# Patient Record
Sex: Female | Born: 1990 | Race: White | Hispanic: No | Marital: Married | Smoking: Never smoker
Health system: Southern US, Community
[De-identification: ages and names within clinical notes are randomized; demographics above are authoritative.]

## PROBLEM LIST (undated history)

## (undated) DIAGNOSIS — E079 Disorder of thyroid, unspecified: Secondary | ICD-10-CM

---

## 2013-02-24 ENCOUNTER — Emergency Department (HOSPITAL_BASED_OUTPATIENT_CLINIC_OR_DEPARTMENT_OTHER)

## 2013-02-24 ENCOUNTER — Emergency Department (HOSPITAL_BASED_OUTPATIENT_CLINIC_OR_DEPARTMENT_OTHER)
Admission: EM | Admit: 2013-02-24 | Discharge: 2013-02-24 | Disposition: A | Discharge status: Home / Self Care | Attending: Emergency Medicine | Admitting: Emergency Medicine

## 2013-02-24 ENCOUNTER — Encounter (HOSPITAL_BASED_OUTPATIENT_CLINIC_OR_DEPARTMENT_OTHER): Payer: Self-pay | Admitting: *Deleted

## 2013-02-24 DIAGNOSIS — Z862 Personal history of diseases of the blood and blood-forming organs and certain disorders involving the immune mechanism: Secondary | ICD-10-CM | POA: Insufficient documentation

## 2013-02-24 DIAGNOSIS — N949 Unspecified condition associated with female genital organs and menstrual cycle: Secondary | ICD-10-CM | POA: Insufficient documentation

## 2013-02-24 DIAGNOSIS — R109 Unspecified abdominal pain: Secondary | ICD-10-CM

## 2013-02-24 DIAGNOSIS — O219 Vomiting of pregnancy, unspecified: Secondary | ICD-10-CM | POA: Insufficient documentation

## 2013-02-24 DIAGNOSIS — O9989 Other specified diseases and conditions complicating pregnancy, childbirth and the puerperium: Secondary | ICD-10-CM | POA: Insufficient documentation

## 2013-02-24 DIAGNOSIS — Z8639 Personal history of other endocrine, nutritional and metabolic disease: Secondary | ICD-10-CM | POA: Insufficient documentation

## 2013-02-24 DIAGNOSIS — O034 Incomplete spontaneous abortion without complication: Secondary | ICD-10-CM

## 2013-02-24 HISTORY — DX: Disorder of thyroid, unspecified: E07.9

## 2013-02-24 LAB — URINALYSIS, ROUTINE W REFLEX MICROSCOPIC
Bilirubin Urine: NEGATIVE
Glucose, UA: NEGATIVE mg/dL
Ketones, ur: NEGATIVE mg/dL
Nitrite: NEGATIVE
Protein, ur: NEGATIVE mg/dL
Specific Gravity, Urine: 1.021 (ref 1.005–1.030)
Urobilinogen, UA: 0.2 mg/dL (ref 0.0–1.0)
pH: 5.5 (ref 5.0–8.0)

## 2013-02-24 LAB — URINE MICROSCOPIC-ADD ON

## 2013-02-24 LAB — HCG, QUANTITATIVE, PREGNANCY: hCG, Beta Chain, Quant, S: 758 m[IU]/mL — ABNORMAL HIGH (ref <5)

## 2013-02-24 LAB — PREGNANCY, URINE: Preg Test, Ur: POSITIVE — AB

## 2013-02-24 NOTE — Discharge Instructions (Signed)
Incomplete Miscarriage °Miscarriages in pregnancy are common. A miscarriage is a pregnancy that has ended before the twentieth week. You have had an incomplete miscarriage. Partial parts of the fetus or placenta (afterbirth) remain behind. Sometimes further treatment is needed. The most common reason for further treatment is continued bleeding (hemorrhage). Tissue left behind may also become infected. Treatment usually is curettage. Curettage for an incomplete abortion is a procedure in which the remaining products of pregnancy are removed. This can be done by a simple sucking procedure (suction curettage). It can also be done by a simple scraping (curettage) of the inside of the uterus (womb). This may be done in the hospital or in the caregiver's office. This is only done when your caregiver knows the pregnancy has ended. This is determined by physical examination and a negative pregnancy test. It may also include an ultrasound to confirm a dead fetus. The ultrasound may also prove that products of the pregnancy remain in the uterus. °If your cervix remains dilated and you are still passing clots and tissue, your caregiver may wish to watch you for a little while. Your caregiver may want to see if you are going to finish passing all of the remaining parts of the pregnancy. If the bleeding continues, they may proceed with curettage. °WHY DO I FEEL THIS WAY °Miscarriages can be a very emotional time for prospective mothers. This is not you or your partner's fault. The miscarriage did not occur because of a lack in you or your partner. Nearly all miscarriages occur because the pregnancy has started off wrongly. At least half of miscarried pregnancies have a chromosomal abnormality (almost always not inherited). Others may have developmental problems with the fetus or placentas. Problems may not show up even when the products miscarried are studied under the microscope. You can usually begin trying for another  pregnancy as soon as your caregiver says it's okay. °HOME CARE INSTRUCTIONS  °· Your caregiver may order bed rest (this means only getting up to use the bathroom). Your caregiver may allow you to continue light activity. If curettage was not done at this time, but you require further treatment. °· Keep track of the number of pads you use each day. Keep track of how saturated (soaked) they are. Record this information. °· Do not use tampons. Do not douche or have sexual intercourse until approved by your caregiver. °· It is very important to keep all follow-up appointments for re-evaluation and continuing management. °· Women who have an Rh negative blood type (ie, A, B, AB, or O negative) need to receive a drug called Rh(D) immune globulin. This medicine helps protect future fetuses against problems that can occur if an Rh negative mother is carrying a baby who is Rh positive. °SEEK IMMEDIATE MEDICAL CARE IF:  °· You experience severe cramps in your stomach, back, or abdomen. °· You run an unexplained temperature (record these). °· You pass large clots or tissue (save any tissue for your caregiver to inspect). °· Your bleeding increases or you become light-headed, weak, or have fainting episodes. °MAKE SURE YOU:  °· Understand these instructions. °· Will watch your condition. °· Will get help right away if you are not doing well or get worse. °Document Released: 09/21/2005 Document Revised: 12/14/2011 Document Reviewed: 05/11/2008 °ExitCare® Patient Information ©2014 [employer]. ° °

## 2013-02-24 NOTE — ED Provider Notes (Signed)
History     CSN: [id number]  Arrival date & time 02/24/13  1310   First MD Initiated Contact with Patient 02/24/13 1400      Chief Complaint  Patient presents with  . Abdominal Pain    (Consider location/radiation/quality/duration/timing/severity/associated sxs/prior treatment) HPI Comments: Pt with h/o hypothyroidism, has been seeing OB/GYN in [place] due to difficulty with pregnancy, became pregnant for 2nd time about 8-9 weeks ago, was found to have fluctuating HCG levels, and never higher than around 1000.  Had a recent U/S which showed no IUP.  They recommended MTX treatment since no lab progression indicated this was an abnormal pregnancy and due to the possibility of a tubal pregnancy.  This was given on Monday.  They told her to expect some bleeding and mild pain.  However if pain got worse, due to the possibility of ectopic pregnancy still, that she should be evaluated in an ED.  Today, pain got much worse.  No fever, chills, N/V.  Pain is somewhat better now, worse with certain movements.  She had a full GYN exam only a few days ago.  No syncope, CP, SOB.    Patient is a 22 y.o. female presenting with abdominal pain. The history is provided by the patient, the spouse and a relative.  Abdominal Pain Associated symptoms include abdominal pain.    Past Medical History  Diagnosis Date  . Thyroid disease     History reviewed. No pertinent past surgical history.  History reviewed. No pertinent family history.  History  Substance Use Topics  . Smoking status: Never Smoker   . Smokeless tobacco: Not on file  . Alcohol Use: No    OB History   Grav Para Term Preterm Abortions TAB SAB Ect Mult Living                  Review of Systems  Constitutional: Negative for fever and chills.  Gastrointestinal: Positive for abdominal pain. Negative for nausea and vomiting.  Genitourinary: Positive for vaginal bleeding and pelvic pain. Negative for dysuria, flank pain and  difficulty urinating.  Neurological: Negative for syncope and light-headedness.  All other systems reviewed and are negative.    Allergies  Review of patient's allergies indicates no known allergies.  Home Medications  No current outpatient prescriptions on file.  BP 113/61  Pulse 85  Temp(Src) 98.5 F (36.9 C) (Oral)  Resp 20  Wt 128 lb (58.06 kg)  SpO2 99%  LMP 02/21/2013  Physical Exam  Nursing note and vitals reviewed. Constitutional: She appears well-developed and well-nourished.  HENT:  Head: Normocephalic and atraumatic.  Eyes: EOM are normal.  Neck: Neck supple.  Cardiovascular: Normal rate and intact distal pulses.   Pulmonary/Chest: Effort normal. She has no wheezes.  Abdominal: Soft. There is tenderness. There is no rebound.  Neurological: She is alert.  Skin: Skin is warm.  Psychiatric: She has a normal mood and affect.    ED Course  Procedures (including critical care time)  Labs Reviewed  URINALYSIS, ROUTINE W REFLEX MICROSCOPIC - Abnormal; Notable for the following:    APPearance CLOUDY (*)    Hgb urine dipstick LARGE (*)    Leukocytes, UA TRACE (*)    All other components within normal limits  PREGNANCY, URINE - Abnormal; Notable for the following:    Preg Test, Ur POSITIVE (*)    All other components within normal limits  URINE MICROSCOPIC-ADD ON - Abnormal; Notable for the following:    Squamous Epithelial / LPF  MANY (*)    Bacteria, UA FEW (*)    All other components within normal limits  HCG, QUANTITATIVE, PREGNANCY - Abnormal; Notable for the following:    hCG, Beta Chain, Quant, S 758 (*)    All other components within normal limits   US Ob Comp Less 14 Wks  02/24/2013   *RADIOLOGY REPORT*  Clinical Data: Seen for 9-week obstetrical ultrasound at outside institution on 02/18/2013 with no intrauterine pregnancy visualized.  The patient was treated with methotrexate and has had pain and vaginal bleeding since 02/19/2013.  Polycystic ovarian  syndrome.  The patient was receiving fertility treatment. Quantitative beta HCG 758  OBSTETRIC <14 WK [place] AND TRANSVAGINAL OB US  Technique:  Both transabdominal and transvaginal ultrasound examinations were performed for complete evaluation of the gestation as well as the maternal uterus, adnexal regions, and pelvic cul-de-sac.  Transvaginal technique was performed to assess early pregnancy.  Comparison:  None.  Intrauterine gestational sac:  Not seen Yolk sac: Not applicable Embryo: Not applicable  Maternal uterus/adnexae: The uterus is anteverted and anteflexed and demonstrates a thin endometrial stripe which is echogenic measuring 6 mm in diameter. No signs of an intrauterine gestational sac are noted.  Both ovaries have a normal appearance with the right ovary measuring 2.2 x 2.7 x 2.1 cm and the left ovary measuring 2.7 x 2.7 x 2.5 cm.  No separate adnexal masses or complex pelvic fluid are identified.  A trace of simple free fluid is noted in the cul-de- sac.  IMPRESSION: Thin endometrial stripe with no evidence for an intrauterine pregnancy.  Normal ovaries with no adnexal masses or complex pelvic fluid to suggest ectopic pregnancy sonographically.  Correlation with serial beta HCG is recommended to assess for therapeutic response to methotrexate.  Today's findings taken alone without prior exams could represent a nonprogressing gestation, gestation too early to be seen with ultrasound or sonographically silent ectopic gestation.   Original Report Authenticated By: Rhodia Albright, M.D.   US Ob Transvaginal  02/24/2013   *RADIOLOGY REPORT*  Clinical Data: Seen for 9-week obstetrical ultrasound at outside institution on 02/18/2013 with no intrauterine pregnancy visualized.  The patient was treated with methotrexate and has had pain and vaginal bleeding since 02/19/2013.  Polycystic ovarian syndrome.  The patient was receiving fertility treatment. Quantitative beta HCG 758  OBSTETRIC <14 WK [place] AND  TRANSVAGINAL OB US  Technique:  Both transabdominal and transvaginal ultrasound examinations were performed for complete evaluation of the gestation as well as the maternal uterus, adnexal regions, and pelvic cul-de-sac.  Transvaginal technique was performed to assess early pregnancy.  Comparison:  None.  Intrauterine gestational sac:  Not seen Yolk sac: Not applicable Embryo: Not applicable  Maternal uterus/adnexae: The uterus is anteverted and anteflexed and demonstrates a thin endometrial stripe which is echogenic measuring 6 mm in diameter. No signs of an intrauterine gestational sac are noted.  Both ovaries have a normal appearance with the right ovary measuring 2.2 x 2.7 x 2.1 cm and the left ovary measuring 2.7 x 2.7 x 2.5 cm.  No separate adnexal masses or complex pelvic fluid are identified.  A trace of simple free fluid is noted in the cul-de- sac.  IMPRESSION: Thin endometrial stripe with no evidence for an intrauterine pregnancy.  Normal ovaries with no adnexal masses or complex pelvic fluid to suggest ectopic pregnancy sonographically.  Correlation with serial beta HCG is recommended to assess for therapeutic response to methotrexate.  Today's findings taken alone without prior exams could  represent a nonprogressing gestation, gestation too early to be seen with ultrasound or sonographically silent ectopic gestation.   Original Report Authenticated By: Rhodia Albright, M.D.     1. Abdominal pain   2. Incomplete miscarriage     ra sat is 99% and I interpret to be normal   3:26 PM  I reviewed U/S results, normal ovaries, no IUP .  HCG is 758, lower than her prior reported value of 800s.  Will d/c home withreassurance, referrals made to other [facility] to use as needed while here in town.  MDM  Pt is not hypotensive, no report of syncope.  Pt just received MTX and in my opinion, more severe pain today would not be out of the ordinary.  Pt is very resistant to having another pelvic examination.   Given her recent history and having had several exams recently, I think getting an quant HCG (family called and their clinic stated there most recent recheck of HCG level was down to 800 following MTX treatment) and a OB U/S here is enough to ensure that pt is not having an obvious tubal pregnancy.  If neg, pt is safe to go home and follow up with her own OB/GYN this week.  Location of pain, exam is not consistent with appendicits, diverticulitis, ureteral stone.  UA is not highly suggestive of UTI.        Gavin Pound. Aaliyana Fredericks, MD 02/24/13 1527

## 2013-02-24 NOTE — ED Notes (Addendum)
Abdominal pain x 30 minutes. Has been taking medication for infertility. She lives in [place] and visiting family. Had an injection of Methotrexate injection x 4 days ago for what they told her may be a tubal pregnancy. No u/s was ever done. She had HCG blood test. States she does not want to be here. Family made her come. She states she is not going to be here all flipping day.

## 2014-04-07 ENCOUNTER — Emergency Department (HOSPITAL_BASED_OUTPATIENT_CLINIC_OR_DEPARTMENT_OTHER): Payer: 59

## 2014-04-07 ENCOUNTER — Emergency Department (HOSPITAL_BASED_OUTPATIENT_CLINIC_OR_DEPARTMENT_OTHER)
Admission: EM | Admit: 2014-04-07 | Discharge: 2014-04-07 | Disposition: A | Discharge status: Home / Self Care | Payer: 59 | Attending: Emergency Medicine | Admitting: Emergency Medicine

## 2014-04-07 ENCOUNTER — Encounter (HOSPITAL_BASED_OUTPATIENT_CLINIC_OR_DEPARTMENT_OTHER): Payer: Self-pay | Admitting: Emergency Medicine

## 2014-04-07 DIAGNOSIS — Z79899 Other long term (current) drug therapy: Secondary | ICD-10-CM | POA: Diagnosis not present

## 2014-04-07 DIAGNOSIS — R0602 Shortness of breath: Secondary | ICD-10-CM | POA: Diagnosis not present

## 2014-04-07 DIAGNOSIS — R05 Cough: Secondary | ICD-10-CM

## 2014-04-07 DIAGNOSIS — R059 Cough, unspecified: Secondary | ICD-10-CM

## 2014-04-07 DIAGNOSIS — E079 Disorder of thyroid, unspecified: Secondary | ICD-10-CM | POA: Diagnosis not present

## 2014-04-07 DIAGNOSIS — Z3202 Encounter for pregnancy test, result negative: Secondary | ICD-10-CM | POA: Diagnosis not present

## 2014-04-07 DIAGNOSIS — J029 Acute pharyngitis, unspecified: Secondary | ICD-10-CM | POA: Diagnosis not present

## 2014-04-07 DIAGNOSIS — R51 Headache: Secondary | ICD-10-CM | POA: Diagnosis not present

## 2014-04-07 LAB — PREGNANCY, URINE: Preg Test, Ur: NEGATIVE

## 2014-04-07 MED ORDER — GUAIFENESIN-CODEINE 100-10 MG/5ML PO SOLN: 5.0000 mL | Freq: Three times a day (TID) | ORAL | Status: AC | PRN

## 2014-04-07 NOTE — ED Notes (Signed)
Pt reports that she has had cough x 1 week, worse at night

## 2014-04-07 NOTE — Discharge Instructions (Signed)
Cough, Adult  A cough is a reflex that helps clear your throat and airways. It can help heal the body or may be a reaction to an irritated airway. A cough may only last 2 or 3 weeks (acute) or may last more than 8 weeks (chronic).  CAUSES Acute cough:  Viral or bacterial infections. Chronic cough:  Infections.  Allergies.  Asthma.  Post-nasal drip.  Smoking.  Heartburn or acid reflux.  Some medicines.  Chronic lung problems (COPD).  Cancer. SYMPTOMS   Cough.  Fever.  Chest pain.  Increased breathing rate.  High-pitched whistling sound when breathing (wheezing).  Colored mucus that you cough up (sputum). TREATMENT   A bacterial cough may be treated with antibiotic medicine.  A viral cough must run its course and will not respond to antibiotics.  Your caregiver may recommend other treatments if you have a chronic cough. HOME CARE INSTRUCTIONS   Only take over-the-counter or prescription medicines for pain, discomfort, or fever as directed by your caregiver. Use cough suppressants only as directed by your caregiver.  Use a cold steam vaporizer or humidifier in your bedroom or home to help loosen secretions.  Sleep in a semi-upright position if your cough is worse at night.  Rest as needed.  Stop smoking if you smoke. SEEK IMMEDIATE MEDICAL CARE IF:   You have pus in your sputum.  Your cough starts to worsen.  You cannot control your cough with suppressants and are losing sleep.  You begin coughing up blood.  You have difficulty breathing.  You develop pain which is getting worse or is uncontrolled with medicine.  You have a fever. MAKE SURE YOU:   Understand these instructions.  Will watch your condition.  Will get help right away if you are not doing well or get worse. Document Released: 03/20/2011 Document Revised: 12/14/2011 Document Reviewed: 03/20/2011 ExitCare Patient Information 2015 [employer]. This information is not intended  to replace advice given to you by your health care provider. Make sure you discuss any questions you have with your health care provider.  

## 2014-04-07 NOTE — ED Provider Notes (Signed)
CSN: [id number]     Arrival date & time 04/07/14  2207 History  This chart was scribed for Audree CamelScott T Leiland Mihelich, MD by Modena JanskyAlbert Thayil, ED Scribe. This patient was seen in room MH06/MH06 and the patient's care was started at 10:46 PM.  Chief Complaint  Patient presents with  . Cough   The history is provided by the patient. No language interpreter was used.   HPI Comments: Betty Patterson is a 23 y.o. female who presents to the Emergency Department complaining of a cough that started a week ago. She states her coughing is worse at night along with SOB during the coughing. She states that her cough is productive but she swallows it immediately and has never seen what the sputum looks like. She reports associated sore throat and headache that also started a week ago. She states that she took Nyquil and Delsym with no relief. She denies any hx of smoking. She also denies any fever, chest pain, and rhinorrhea.  Past Medical History  Diagnosis Date  . Thyroid disease    History reviewed. No pertinent past surgical history. History reviewed. No pertinent family history. History  Substance Use Topics  . Smoking status: Never Smoker   . Smokeless tobacco: Not on file  . Alcohol Use: No   OB History   Grav Para Term Preterm Abortions TAB SAB Ect Mult Living                 Review of Systems  Constitutional: Negative for fatigue.  HENT: Positive for sore throat. Negative for rhinorrhea.   Respiratory: Positive for cough and shortness of breath.   Cardiovascular: Negative for chest pain.  Neurological: Positive for headaches.  All other systems reviewed and are negative.   Allergies  Review of patient's allergies indicates no known allergies.  Home Medications   Prior to Admission medications   Medication Sig Start Date End Date Taking? Authorizing Provider  guaiFENesin (ROBITUSSIN) 100 MG/5ML SOLN Take 5 mLs by mouth every 4 (four) hours as needed for cough or to loosen phlegm.   Yes  Historical Provider, MD  levothyroxine (SYNTHROID, LEVOTHROID) 100 MCG tablet Take 100 mcg by mouth daily before breakfast.   Yes Historical Provider, MD   BP 119/68  Pulse 87  Temp(Src) 98.2 F (36.8 C) (Oral)  Resp 20  Ht 5\' 2"  (1.575 m)  Wt 134 lb (60.782 kg)  BMI 24.50 kg/m2  SpO2 100%  LMP 03/04/2014 Physical Exam  Nursing note and vitals reviewed. Constitutional: She is oriented to person, place, and time. She appears well-developed and well-nourished. No distress.  HENT:  Head: Normocephalic and atraumatic.  Mild posterior oropharyngeal erythema. Tonsils appear normal.  Eyes: Right eye exhibits no discharge. Left eye exhibits no discharge.  Neck: Neck supple.  Cardiovascular: Normal rate, regular rhythm and normal heart sounds.   Pulmonary/Chest: Effort normal and breath sounds normal. No respiratory distress. She has no wheezes. She has no rales.  Musculoskeletal: She exhibits no edema.  Neurological: She is alert and oriented to person, place, and time.  Skin: Skin is warm and dry.  Psychiatric: She has a normal mood and affect. Her behavior is normal.    ED Course  Procedures (including critical care time) DIAGNOSTIC STUDIES: Oxygen Saturation is 100% on RA, normal by my interpretation.    COORDINATION OF CARE: 10:50 PM- Pt advised of plan for treatment which includes radiology and labs and pt agrees.  Labs Review Labs Reviewed  PREGNANCY, URINE  Imaging Review Dg Chest 2 View  04/07/2014   CLINICAL DATA:  Cough.  EXAM: CHEST  2 VIEW  COMPARISON:  None.  FINDINGS: The heart size and mediastinal contours are within normal limits. Both lungs are clear. The visualized skeletal structures are unremarkable.  IMPRESSION: No active cardiopulmonary disease.   Electronically Signed   By: Charlett NoseKevin  Dover M.D.   On: 04/07/2014 23:29     EKG Interpretation None      MDM   Final diagnoses:  Cough    No PNA on Xray. No wheezing or focal lung abnormalities. Appears  well. Most likely a viral URI given her constellation of symptoms. Will treat with cough suppressants, NSAIDs, fluids and expectant management. Advised of return precautions.   I personally performed the services described in this documentation, which was scribed in my presence. The recorded information has been reviewed and is accurate.     Audree CamelScott T Hovanes Hymas, MD 04/07/14 [phone]

## 2014-05-26 ENCOUNTER — Encounter (HOSPITAL_BASED_OUTPATIENT_CLINIC_OR_DEPARTMENT_OTHER): Payer: Self-pay | Admitting: Emergency Medicine

## 2014-05-26 ENCOUNTER — Emergency Department (HOSPITAL_BASED_OUTPATIENT_CLINIC_OR_DEPARTMENT_OTHER)
Admission: EM | Admit: 2014-05-26 | Discharge: 2014-05-26 | Disposition: A | Discharge status: Home / Self Care | Payer: 59 | Attending: Emergency Medicine | Admitting: Emergency Medicine

## 2014-05-26 ENCOUNTER — Emergency Department (HOSPITAL_BASED_OUTPATIENT_CLINIC_OR_DEPARTMENT_OTHER): Payer: 59

## 2014-05-26 DIAGNOSIS — E079 Disorder of thyroid, unspecified: Secondary | ICD-10-CM | POA: Diagnosis not present

## 2014-05-26 DIAGNOSIS — Z79899 Other long term (current) drug therapy: Secondary | ICD-10-CM | POA: Diagnosis not present

## 2014-05-26 DIAGNOSIS — N949 Unspecified condition associated with female genital organs and menstrual cycle: Secondary | ICD-10-CM | POA: Diagnosis not present

## 2014-05-26 DIAGNOSIS — R1031 Right lower quadrant pain: Secondary | ICD-10-CM | POA: Insufficient documentation

## 2014-05-26 DIAGNOSIS — R102 Pelvic and perineal pain: Secondary | ICD-10-CM

## 2014-05-26 MED ORDER — SODIUM CHLORIDE 0.9 % IV BOLUS (SEPSIS)
1000.0000 mL | Freq: Once | INTRAVENOUS | Status: AC
  Administered 2014-05-26: 1000 mL via INTRAVENOUS

## 2014-05-26 MED ORDER — ACETAMINOPHEN 500 MG PO TABS
1000.0000 mg | ORAL_TABLET | Freq: Once | ORAL | Status: AC
  Administered 2014-05-26: 1000 mg via ORAL
  Filled 2014-05-26: qty 2

## 2014-05-26 MED ORDER — HYDROCODONE-ACETAMINOPHEN 5-325 MG PO TABS: 1.0000 | ORAL_TABLET | ORAL | Status: AC | PRN

## 2014-05-26 MED ORDER — HYDROCODONE-ACETAMINOPHEN 5-325 MG PO TABS
1.0000 | ORAL_TABLET | Freq: Once | ORAL | Status: DC
  Filled 2014-05-26: qty 1

## 2014-05-26 NOTE — ED Notes (Signed)
Reports lower right abdominal pain. Pt recently had intra uterine insemination on Wednesday. Advised to come in if the pain got worse.

## 2014-05-26 NOTE — Discharge Instructions (Signed)
FOLLOW UP WITH YOUR INFERTILITY PHYSICIAN NEXT WEEK FOR RECHECK OF PELVIC PAIN THOUGHT SECONDARY TO RECENT PROCEDURE.

## 2014-05-26 NOTE — ED Provider Notes (Signed)
CSN: [id number]     Arrival date & time 05/26/14  1859 History   First MD Initiated Contact with Patient 05/26/14 1920     Chief Complaint  Patient presents with  . Abdominal Pain     (Consider location/radiation/quality/duration/timing/severity/associated sxs/prior Treatment) Patient is a 23 y.o. female presenting with abdominal pain. The history is provided by the patient. No language interpreter was used.  Abdominal Pain Pain location:  RLQ Pain quality: sharp   Associated symptoms: no chest pain, no dysuria, no fever, no nausea, no shortness of breath, no vaginal bleeding, no vaginal discharge and no vomiting   Associated symptoms comment:  She presents with complaint of right pelvic pain that started 4 days ago after artificial in[facility]. No fever, vaginal bleeding, vaginal discharge, nausea or vomiting. She was seen the day after the procedure by her infertility doctor and was told her "ovary was swollen" and if pain increased to go for a repeat ultrasound to rule out torsion.    Past Medical History  Diagnosis Date  . Thyroid disease    History reviewed. No pertinent past surgical history. No family history on file. History  Substance Use Topics  . Smoking status: Never Smoker   . Smokeless tobacco: Not on file  . Alcohol Use: No   OB History   Grav Para Term Preterm Abortions TAB SAB Ect Mult Living                 Review of Systems  Constitutional: Negative for fever.  Respiratory: Negative for shortness of breath.   Cardiovascular: Negative for chest pain.  Gastrointestinal: Positive for abdominal pain. Negative for nausea and vomiting.  Genitourinary: Positive for pelvic pain. Negative for dysuria, vaginal bleeding and vaginal discharge.  Musculoskeletal: Negative for myalgias.      Allergies  Review of patient's allergies indicates no known allergies.  Home Medications   Prior to Admission medications   Medication Sig  Start Date End Date Taking? Authorizing Provider  guaiFENesin (ROBITUSSIN) 100 MG/5ML SOLN Take 5 mLs by mouth every 4 (four) hours as needed for cough or to loosen phlegm.    Historical Provider, MD  guaiFENesin-codeine 100-10 MG/5ML syrup Take 5-10 mLs by mouth 3 (three) times daily as needed for cough. 04/07/14   Audree CamelScott T Goldston, MD  levothyroxine (SYNTHROID, LEVOTHROID) 100 MCG tablet Take 100 mcg by mouth daily before breakfast.    Historical Provider, MD   BP 136/66  Pulse 100  Temp(Src) 98 F (36.7 C) (Oral)  Resp 16  Ht 5\' 2"  (1.575 m)  Wt 140 lb (63.504 kg)  BMI 25.60 kg/m2  SpO2 100% Physical Exam  Constitutional: She is oriented to person, place, and time. She appears well-developed and well-nourished. No distress.  Neck: Normal range of motion.  Pulmonary/Chest: Effort normal.  Abdominal: She exhibits no distension and no mass. There is no guarding.  RLQ/right pelvic tenderness. No other abdominal tenderness appreciated. Abdomen soft.   Genitourinary:  Pelvic exam deferred given recent procedure.  Musculoskeletal: Normal range of motion.  Neurological: She is alert and oriented to person, place, and time.  Skin: Skin is warm and dry.  Psychiatric: She has a normal mood and affect.    ED Course  Procedures (including critical care time) Labs Review Labs Reviewed - No data to display Results for orders placed during the hospital encounter of 04/07/14  PREGNANCY, URINE      Result Value Ref Range   Preg Test, Ur  NEGATIVE  NEGATIVE   US Pelvis Complete  05/26/2014   CLINICAL DATA:  23 year old female undergoing fertility treatments, including ovarian stimulation medications and recently status post artificial insemination. Right lower quadrant pain. Initial encounter.  EXAM: TRANSABDOMINAL ULTRASOUND OF PELVIS  DOPPLER ULTRASOUND OF OVARIES  TECHNIQUE: Transabdominal ultrasound examination of the pelvis was performed including evaluation of the uterus, ovaries, adnexal  regions, and pelvic cul-de-sac.  Color and duplex Doppler ultrasound was utilized to evaluate blood flow to the ovaries.  COMPARISON:  Pelvis ultrasound 02/24/2013 and earlier.  FINDINGS: Uterus  Measurements: 8.2 x 3.6 x 5.0 cm No fibroids or other mass visualized.  Endometrium  Thickness: 16 mm  No focal abnormality visualized.  Right ovary  Measurements: 6.9 x 5.3 x 5.8 cm. Numerous follicles, individually up to 35-40 mm diameter. No adnexal mass.  Left ovary  Measurements: 3.9 x 3.1 x 3.3 cm. Multiple smaller follicles. Normal appearance/no adnexal mass.  Pulsed Doppler evaluation demonstrates normal low-resistance arterial and venous waveforms in both ovaries.  Other findings:  No free fluid.  IMPRESSION: No pathology identified in the pelvis. No evidence of ovarian torsion. Right greater than left ovarian follicles compatible with recent ovarian stimulation.   Electronically Signed   By: Augusto Gamble M.D.   On: 05/26/2014 20:58   [place] Art/ven Flow Abd Pelv Doppler  05/26/2014   CLINICAL DATA:  23 year old female undergoing fertility treatments, including ovarian stimulation medications and recently status post artificial insemination. Right lower quadrant pain. Initial encounter.  EXAM: TRANSABDOMINAL ULTRASOUND OF PELVIS  DOPPLER ULTRASOUND OF OVARIES  TECHNIQUE: Transabdominal ultrasound examination of the pelvis was performed including evaluation of the uterus, ovaries, adnexal regions, and pelvic cul-de-sac.  Color and duplex Doppler ultrasound was utilized to evaluate blood flow to the ovaries.  COMPARISON:  Pelvis ultrasound 02/24/2013 and earlier.  FINDINGS: Uterus  Measurements: 8.2 x 3.6 x 5.0 cm No fibroids or other mass visualized.  Endometrium  Thickness: 16 mm  No focal abnormality visualized.  Right ovary  Measurements: 6.9 x 5.3 x 5.8 cm. Numerous follicles, individually up to 35-40 mm diameter. No adnexal mass.  Left ovary  Measurements: 3.9 x 3.1 x 3.3 cm. Multiple smaller follicles. Normal  appearance/no adnexal mass.  Pulsed Doppler evaluation demonstrates normal low-resistance arterial and venous waveforms in both ovaries.  Other findings:  No free fluid.  IMPRESSION: No pathology identified in the pelvis. No evidence of ovarian torsion. Right greater than left ovarian follicles compatible with recent ovarian stimulation.   Electronically Signed   By: Augusto Gamble M.D.   On: 05/26/2014 20:58   Imaging Review No results found.   EKG Interpretation None      MDM   Final diagnoses:  None    1. Pelvic pain  US showing enlarged ovaries c/w follicle stimulation. No torsion. Discussed with infertility clinic in [facility] who advises pain medication ok, follow up in office this week. No further evaluation. Patient is feeling better with pain management in ED.     Arnoldo Hooker, PA-C 06/02/14 1004

## 2014-06-03 NOTE — ED Provider Notes (Signed)
  Medical screening examination/treatment/procedure(s) were performed by non-physician practitioner and as supervising physician I was immediately available for consultation/collaboration.   EKG Interpretation None         Gerhard Munch, MD 06/03/14 0201

## 2014-12-26 IMAGING — US US ART/VEN ABD/PELV/SCROTUM DOPPLER LTD
1 series · 3 of 3 positions shown · non-contrast
Comparison: Pelvis ultrasound 02/24/2013 and earlier.

CLINICAL DATA: 23-year-old female undergoing fertility treatments,
including ovarian stimulation medications and recently status post
artificial insemination. Right lower quadrant pain. Initial
encounter.

EXAM:
TRANSABDOMINAL ULTRASOUND OF PELVIS
DOPPLER ULTRASOUND OF OVARIES
TECHNIQUE: Transabdominal ultrasound examination of the pelvis was performed
including evaluation of the uterus, ovaries, adnexal regions, and
pelvic cul-de-sac.
Color and duplex Doppler ultrasound was utilized to evaluate blood
flow to the ovaries.

[Series 1: us art/ven abd/pelv/scrotum doppler ltd · 0.30mm/px · 3 of 3 slices shown]
[im 1/3]
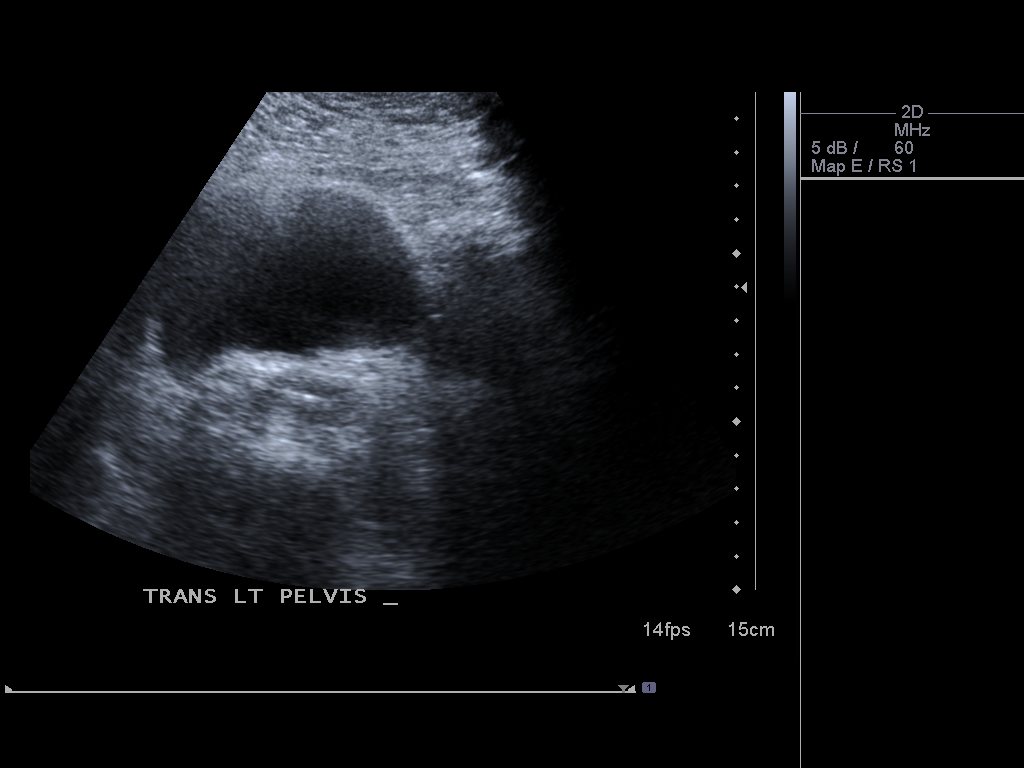
[im 2/3]
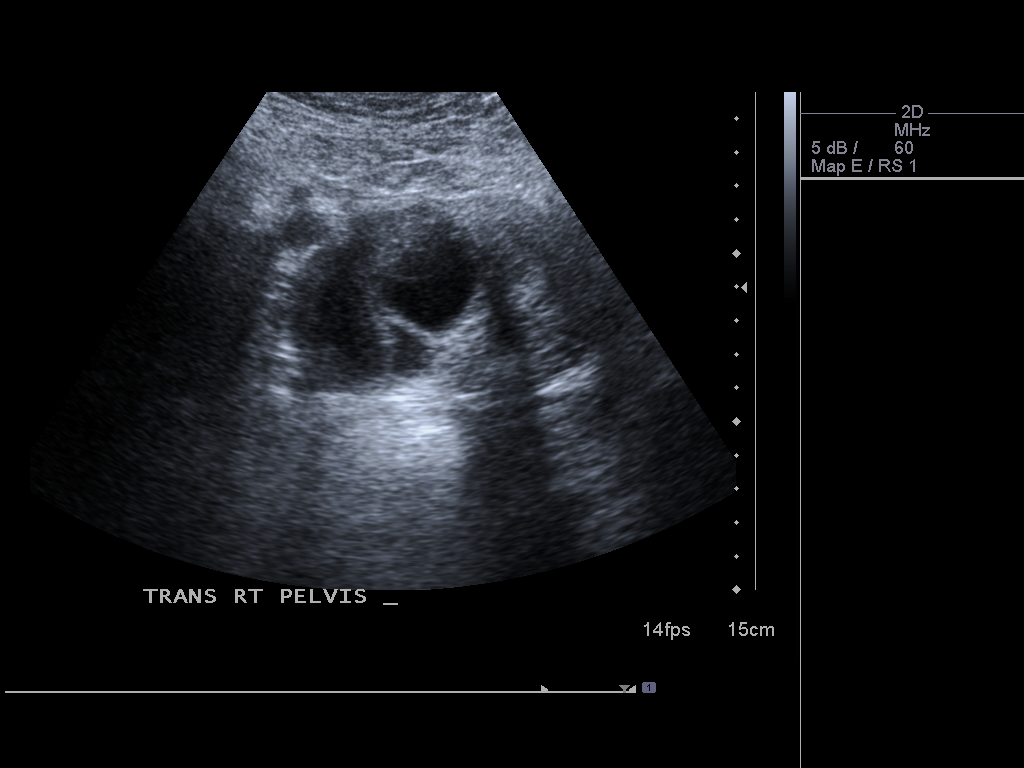
[im 3/3]
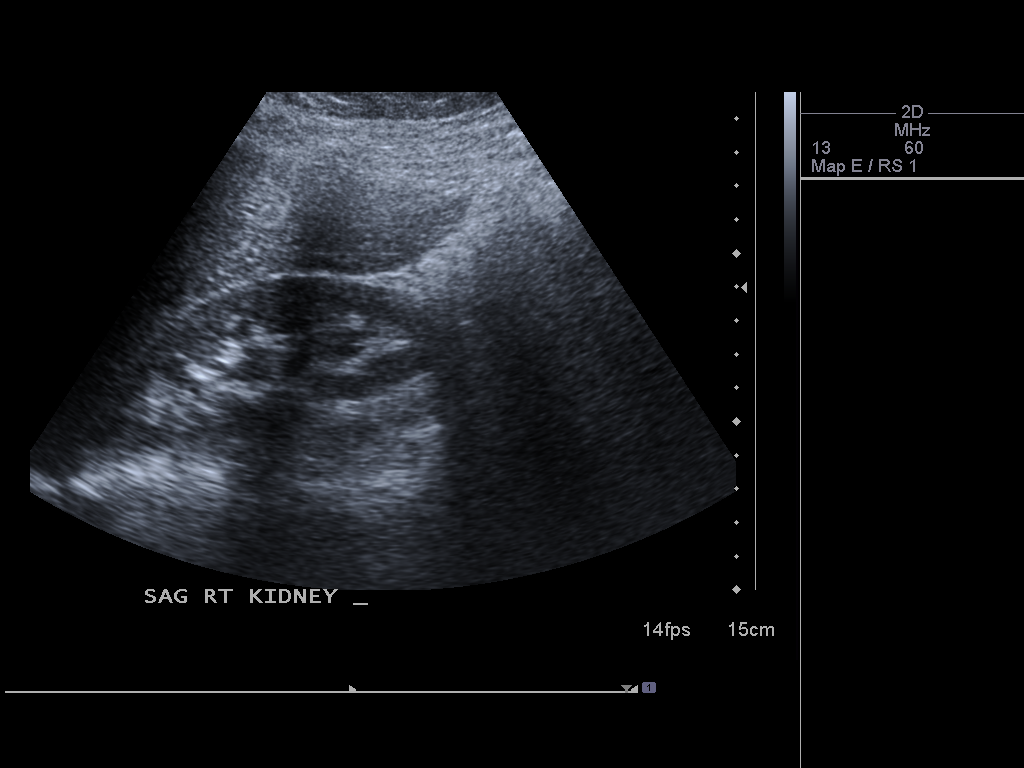

[3 of 3 positions shown; findings below may reference images not displayed]

FINDINGS: Uterus

Measurements: 8.2 x 3.6 x 5.0 cm No fibroids or other mass
visualized.

Endometrium

Thickness: 16 mm  No focal abnormality visualized.

Right ovary

Measurements: 6.9 x 5.3 x 5.8 cm. Numerous follicles, individually
up to 35-40 mm diameter. No adnexal mass.

Left ovary

Measurements: 3.9 x 3.1 x 3.3 cm. Multiple smaller follicles. Normal
appearance/no adnexal mass.

Pulsed Doppler evaluation demonstrates normal low-resistance
arterial and venous waveforms in both ovaries.

Other findings:

No free fluid.
IMPRESSION: No pathology identified in the pelvis. No evidence of ovarian
torsion. Right greater than left ovarian follicles compatible with
recent ovarian stimulation.

## 2014-12-26 IMAGING — US US ART/VEN ABD/PELV/SCROTUM DOPPLER LTD
1 series · 13 of 25 positions shown · non-contrast
Comparison: Pelvis ultrasound 02/24/2013 and earlier.

CLINICAL DATA: 23-year-old female undergoing fertility treatments,
including ovarian stimulation medications and recently status post
artificial insemination. Right lower quadrant pain. Initial
encounter.

EXAM:
TRANSABDOMINAL ULTRASOUND OF PELVIS
DOPPLER ULTRASOUND OF OVARIES
TECHNIQUE: Transabdominal ultrasound examination of the pelvis was performed
including evaluation of the uterus, ovaries, adnexal regions, and
pelvic cul-de-sac.
Color and duplex Doppler ultrasound was utilized to evaluate blood
flow to the ovaries.

[Series 1: us art/ven abd/pelv/scrotum doppler ltd · 0.28mm/px · 78 acquisitions, 13 frames shown]
[im 1/78]
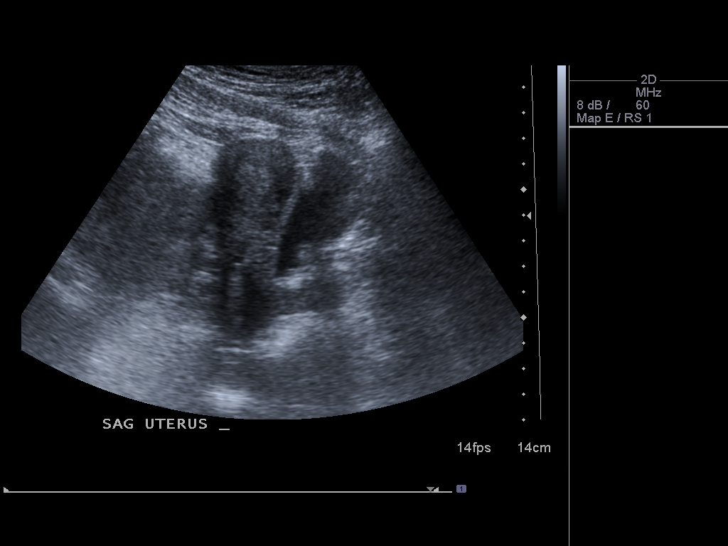
[im 7/78]
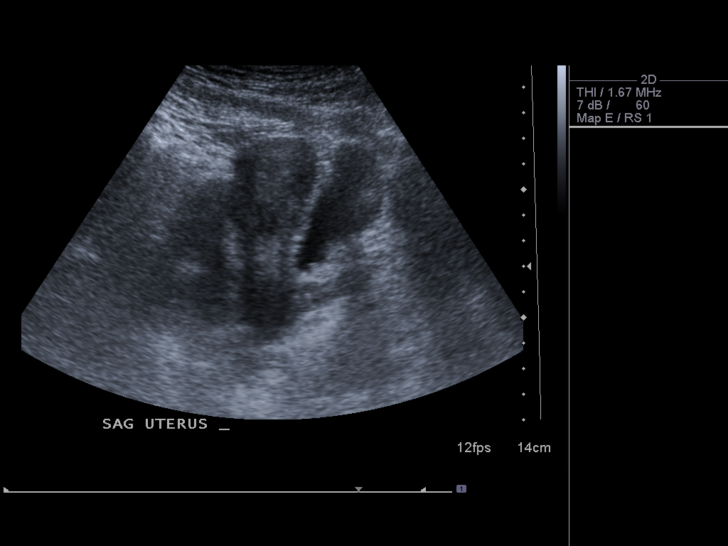
[im 13/78]
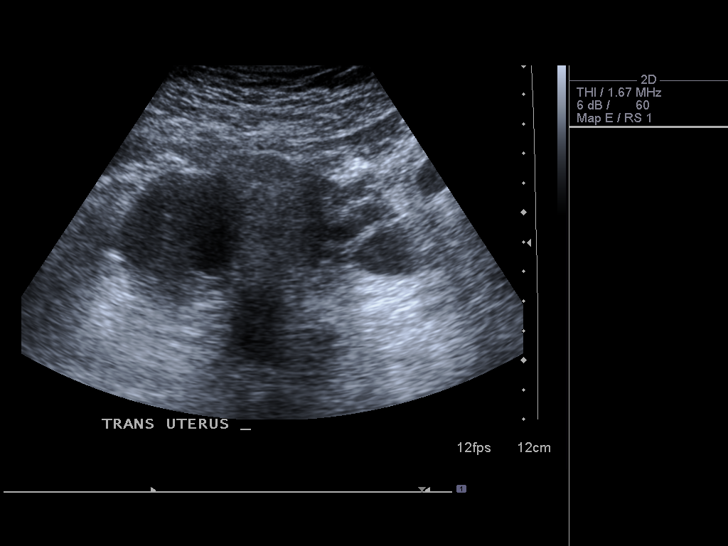
[im 20/78]
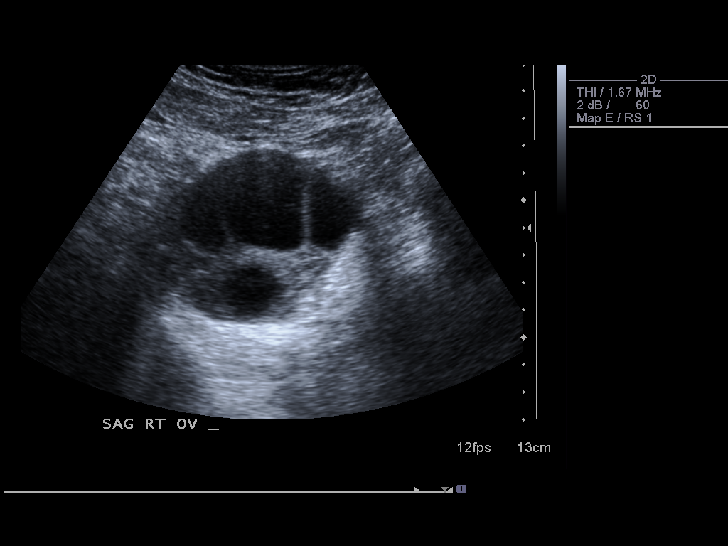
[im 26/78]
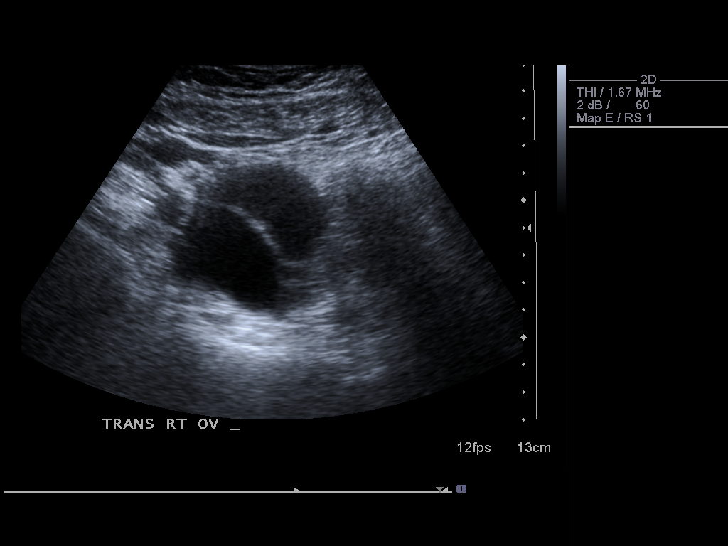
[im 33/78]
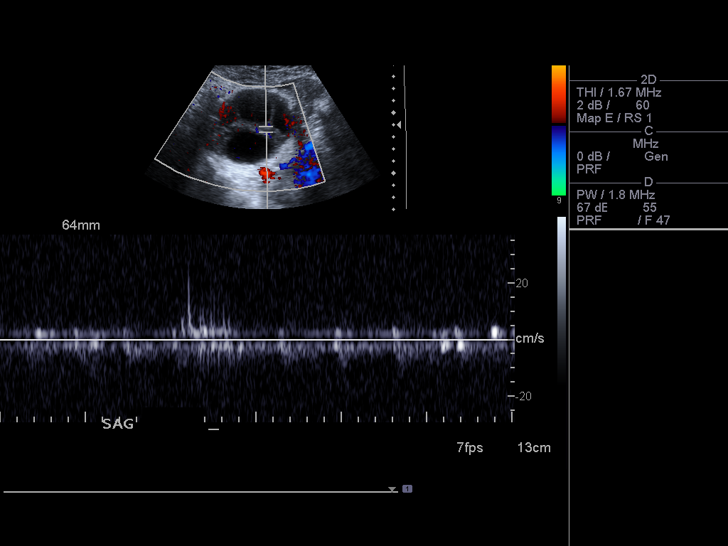
[im 39/78]
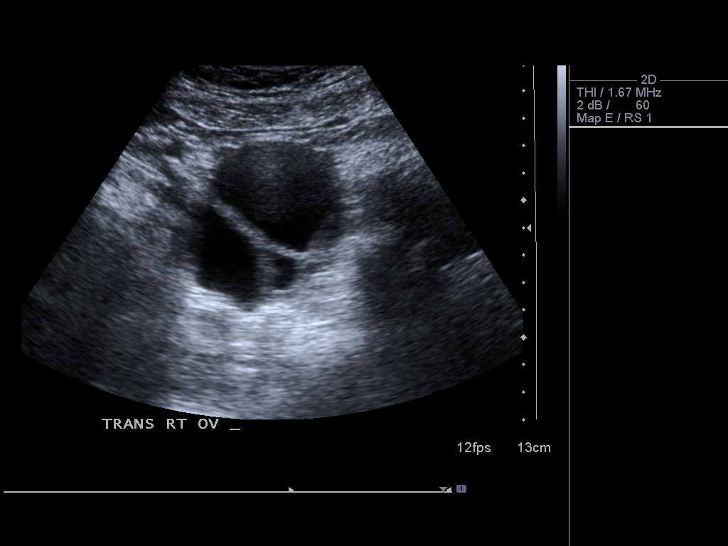
[im 45/78]
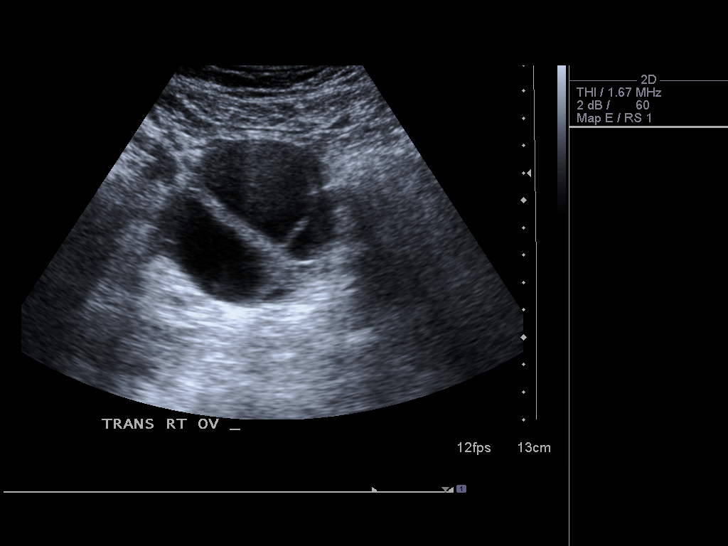
[im 52/78]
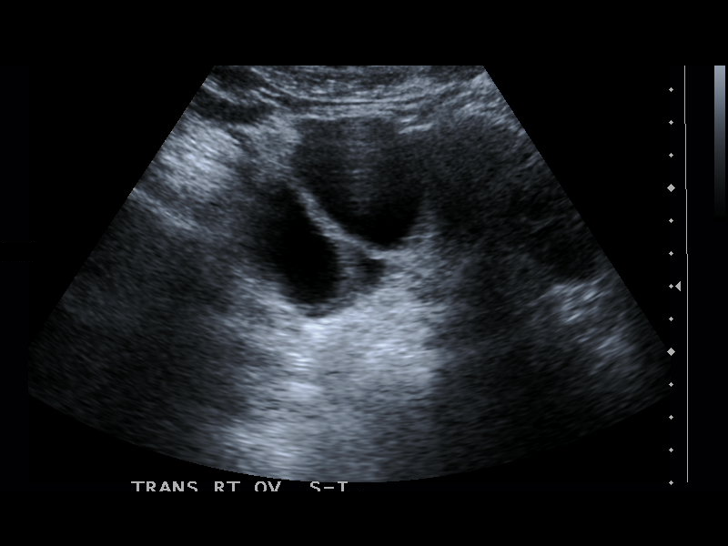
[im 58/78]
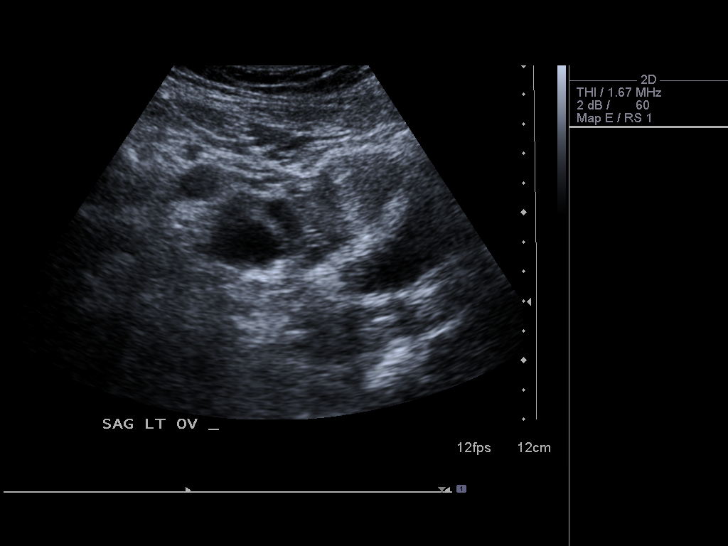
[im 65/78]
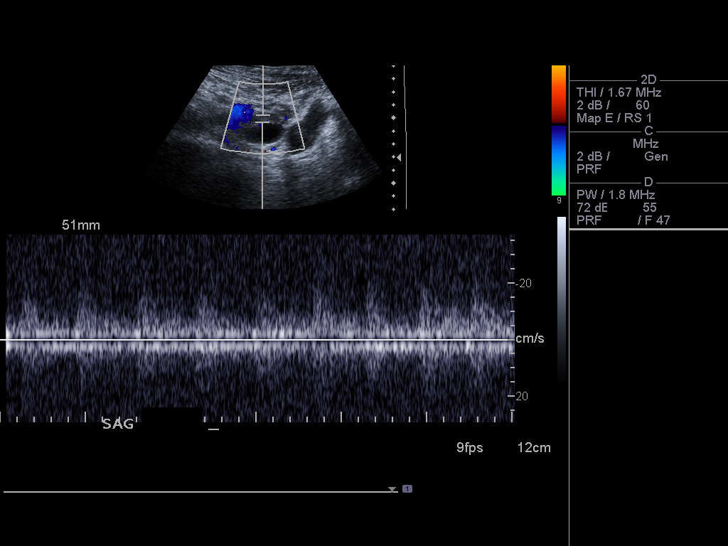
[im 71/78]
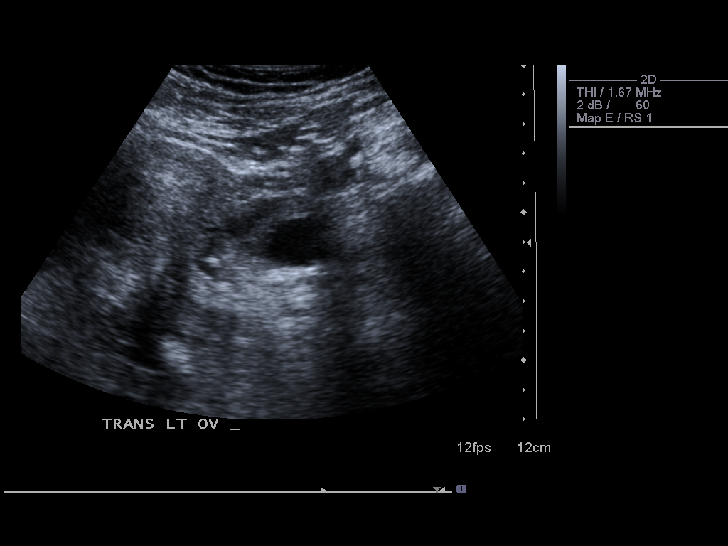
[im 78/78]
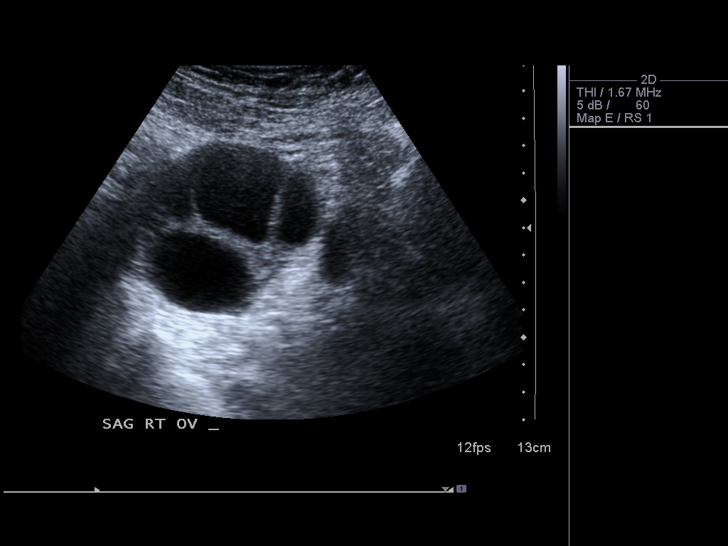

[13 of 25 positions shown; findings below may reference images not displayed]

FINDINGS: Uterus

Measurements: 8.2 x 3.6 x 5.0 cm No fibroids or other mass
visualized.

Endometrium

Thickness: 16 mm  No focal abnormality visualized.

Right ovary

Measurements: 6.9 x 5.3 x 5.8 cm. Numerous follicles, individually
up to 35-40 mm diameter. No adnexal mass.

Left ovary

Measurements: 3.9 x 3.1 x 3.3 cm. Multiple smaller follicles. Normal
appearance/no adnexal mass.

Pulsed Doppler evaluation demonstrates normal low-resistance
arterial and venous waveforms in both ovaries.

Other findings:

No free fluid.
IMPRESSION: No pathology identified in the pelvis. No evidence of ovarian
torsion. Right greater than left ovarian follicles compatible with
recent ovarian stimulation.

## 2018-04-16 ENCOUNTER — Other Ambulatory Visit: Payer: Self-pay

## 2018-04-16 ENCOUNTER — Encounter (HOSPITAL_BASED_OUTPATIENT_CLINIC_OR_DEPARTMENT_OTHER): Payer: Self-pay | Admitting: Emergency Medicine

## 2018-04-16 ENCOUNTER — Emergency Department (HOSPITAL_BASED_OUTPATIENT_CLINIC_OR_DEPARTMENT_OTHER)
Admission: EM | Admit: 2018-04-16 | Discharge: 2018-04-16 | Discharge status: Against Medical Advice | Attending: Emergency Medicine | Admitting: Emergency Medicine

## 2018-04-16 DIAGNOSIS — Z79899 Other long term (current) drug therapy: Secondary | ICD-10-CM | POA: Insufficient documentation

## 2018-04-16 DIAGNOSIS — R112 Nausea with vomiting, unspecified: Secondary | ICD-10-CM | POA: Diagnosis not present

## 2018-04-16 DIAGNOSIS — R197 Diarrhea, unspecified: Secondary | ICD-10-CM | POA: Insufficient documentation

## 2018-04-16 LAB — URINALYSIS, ROUTINE W REFLEX MICROSCOPIC
Bilirubin Urine: NEGATIVE
Glucose, UA: NEGATIVE mg/dL
HGB URINE DIPSTICK: NEGATIVE
Ketones, ur: NEGATIVE mg/dL
Leukocytes, UA: NEGATIVE
NITRITE: NEGATIVE
PROTEIN: NEGATIVE mg/dL
Specific Gravity, Urine: >1.03 — ABNORMAL HIGH (ref 1.005–1.030)
pH: 5.5 (ref 5.0–8.0)

## 2018-04-16 LAB — COMPREHENSIVE METABOLIC PANEL
ALT: 14 U/L (ref 0–44)
AST: 18 U/L (ref 15–41)
Albumin: 4.1 g/dL (ref 3.5–5.0)
Alkaline Phosphatase: 67 U/L (ref 38–126)
Anion gap: 7 (ref 5–15)
BUN: 14 mg/dL (ref 6–20)
CHLORIDE: 105 mmol/L (ref 98–111)
CO2: 28 mmol/L (ref 22–32)
Calcium: 8.9 mg/dL (ref 8.9–10.3)
Creatinine, Ser: 0.71 mg/dL (ref 0.44–1.00)
GFR calc non Af Amer: >60 mL/min (ref >60)
Glucose, Bld: 93 mg/dL (ref 70–99)
POTASSIUM: 3.7 mmol/L (ref 3.5–5.1)
SODIUM: 140 mmol/L (ref 135–145)
Total Bilirubin: 0.6 mg/dL (ref 0.3–1.2)
Total Protein: 7.4 g/dL (ref 6.5–8.1)

## 2018-04-16 LAB — CBC
HEMATOCRIT: 42.5 % (ref 36.0–46.0)
Hemoglobin: 14.3 g/dL (ref 12.0–15.0)
MCH: 29.2 pg (ref 26.0–34.0)
MCHC: 33.6 g/dL (ref 30.0–36.0)
MCV: 86.7 fL (ref 78.0–100.0)
Platelets: 185 10*3/uL (ref 150–400)
RBC: 4.9 MIL/uL (ref 3.87–5.11)
RDW: 13.6 % (ref 11.5–15.5)
WBC: 4.7 10*3/uL (ref 4.0–10.5)

## 2018-04-16 LAB — LIPASE, BLOOD: LIPASE: 30 U/L (ref 11–51)

## 2018-04-16 LAB — PREGNANCY, URINE: PREG TEST UR: NEGATIVE

## 2018-04-16 MED ORDER — SODIUM CHLORIDE 0.9 % IV BOLUS: 1000.0000 mL | Freq: Once | INTRAVENOUS | Status: DC

## 2018-04-16 MED ORDER — ONDANSETRON HCL 4 MG/2ML IJ SOLN
4.0000 mg | Freq: Once | INTRAMUSCULAR | Status: AC
  Administered 2018-04-16: 4 mg via INTRAVENOUS
  Filled 2018-04-16: qty 2

## 2018-04-16 NOTE — ED Notes (Signed)
Pt states an emergency happens at home that she has to go back to take care of her kids, she doesn't has no one to take care of the kids while she is here she will come back at a later time. Pt oriented that we don't have all of her results back reason why the provider can't dc her at this time and that she will have to leave AMA, pt verbalize understanding and sing AMA.

## 2018-04-16 NOTE — ED Provider Notes (Signed)
[facility] EMERGENCY DEPARTMENT Provider Note   CSN: [id number] Arrival date & time: 04/16/18  1828     History   Chief Complaint Chief Complaint  Patient presents with  . Emesis    HPI Betty Patterson is a 27 y.o. female past medical history of thyroid disease who presents for evaluation of nausea, vomiting for last 2 days and diarrhea that began yesterday.  She estimates approximate 5 episodes of nonbloody, nonbilious vomiting a day.  She reports that she has not been able to eat and drink much over the last 2 days.  She reports diarrhea began yesterday.  She reports 3 episodes of nonbloody stools.  Patient states that she does not have any abdominal pain just "her stomach feels sick."  She states her daughter had similar symptoms prior to her symptoms but states they only lasted for 24 hours.  Patient states that she ate some chicken salad prior to onset of symptoms but she states she has eaten it before.  She states nobody else in the house ate the same chicken salad.  Patient denies any fevers, chest pain, difficulty breathing, urinary complaints.  The history is provided by the patient.    Past Medical History:  Diagnosis Date  . Thyroid disease     There are no active problems to display for this patient.   History reviewed. No pertinent surgical history.   OB History   None      Home Medications    Prior to Admission medications   Medication Sig Start Date End Date Taking? Authorizing Provider  guaiFENesin (ROBITUSSIN) 100 MG/5ML SOLN Take 5 mLs by mouth every 4 (four) hours as needed for cough or to loosen phlegm.    [provider]  guaiFENesin-codeine 100-10 MG/5ML syrup Take 5-10 mLs by mouth 3 (three) times daily as needed for cough. 04/07/14   Pricilla Loveless, MD  HYDROcodone-acetaminophen (NORCO/VICODIN) 5-325 MG per tablet Take 1-2 tablets by mouth every 4 (four) hours as needed. 05/26/14   Elpidio Anis, PA-C  levothyroxine (SYNTHROID,  LEVOTHROID) 100 MCG tablet Take 100 mcg by mouth daily before breakfast.    [provider]    Family History History reviewed. No pertinent family history.  Social History Social History   Tobacco Use  . Smoking status: Never Smoker  . Smokeless tobacco: Never Used  Substance Use Topics  . Alcohol use: No  . Drug use: No     Allergies   Promethazine   Review of Systems Review of Systems  Constitutional: Negative for fever.  Respiratory: Negative for cough and shortness of breath.   Cardiovascular: Negative for chest pain.  Gastrointestinal: Positive for diarrhea, nausea and vomiting. Negative for abdominal pain and blood in stool.  Genitourinary: Negative for dysuria and hematuria.  Neurological: Negative for headaches.     Physical Exam Updated Vital Signs BP 110/70 (BP Location: Left Arm)   Pulse (!) 109   Temp 98.4 F (36.9 C) (Oral)   Resp 20   Ht 5\' 2"  (1.575 m)   Wt 68 kg (150 lb)   LMP 04/04/2018   SpO2 100%   BMI 27.44 kg/m   Physical Exam  Constitutional: She is oriented to person, place, and time. She appears well-developed and well-nourished.  HENT:  Head: Normocephalic and atraumatic.  Mouth/Throat: Oropharynx is clear and moist and mucous membranes are normal.  Eyes: Pupils are equal, round, and reactive to light. Conjunctivae, EOM and lids are normal.  Neck: Full passive range of  motion without pain.  Cardiovascular: Normal rate, regular rhythm, normal heart sounds and normal pulses. Exam reveals no gallop and no friction rub.  No murmur heard. Pulmonary/Chest: Effort normal and breath sounds normal.  Abdominal: Soft. Normal appearance. There is generalized tenderness. There is no rigidity and no guarding.  Abdomen is soft, nondistended.  Very minimal diffuse generalized tenderness with no focal point.  No CVA tenderness bilaterally.  No tenderness palpation noted to McBurney's point.  Musculoskeletal: Normal range of motion.    Neurological: She is alert and oriented to person, place, and time.  Skin: Skin is warm and dry. Capillary refill takes less than 2 seconds.  Psychiatric: She has a normal mood and affect. Her speech is normal.  Nursing note and vitals reviewed.    ED Treatments / Results  Labs (all labs ordered are listed, but only abnormal results are displayed) Labs Reviewed  URINALYSIS, ROUTINE W REFLEX MICROSCOPIC - Abnormal; Notable for the following components:      Result Value   APPearance HAZY (*)    Specific Gravity, Urine >1.030 (*)    All other components within normal limits  PREGNANCY, URINE  LIPASE, BLOOD  COMPREHENSIVE METABOLIC PANEL  CBC    EKG None  Radiology No results found.  Procedures Procedures (including critical care time)  Medications Ordered in ED Medications  sodium chloride 0.9 % bolus 1,000 mL (1,000 mLs Intravenous Not Given 04/16/18 1939)  ondansetron (ZOFRAN) injection 4 mg (4 mg Intravenous Given 04/16/18 1937)     Initial Impression / Assessment and Plan / ED Course  I have reviewed the triage vital signs and the nursing notes.  Pertinent labs & imaging results that were available during my care of the patient were reviewed by me and considered in my medical decision making (see chart for details).     27 year old female who presents for evaluation of 2 days of nausea/vomiting.  Diarrhea began yesterday.  Emesis is nonbloody, nonbilious.  No blood in stool.  Patient states that her daughter had similar symptoms prior to her onset.  Patient denies any fevers, difficulty breathing, chest pain, urinary complaints. Patient is afebrile, non-toxic appearing, sitting comfortably on examination table. Vital signs reviewed. She is slightly tachycardic.  Patient with very minimal generalized abdominal tenderness with no focal point.  Consider viral GI process versus gastritis.  History/physical exam not concerning for appendicitis, hepatobiliary etiology,  perforation, obstruction, ovarian etiology.  Plan check basic labs.  Will give antiemetics, IV fluids and reassess.  CBC is without any significant leukocytosis or anemia.  Urine pregnancy negative.  UA negative for any infection or infectious etiology.  RN informed me that patient needed to leave to attend to a emergency at home.  Patient advised that work-up is not complete and will leave AMA.  Final Clinical Impressions(s) / ED Diagnoses   Final diagnoses:  Nausea vomiting and diarrhea    ED Discharge Orders    None       Rosana HoesLayden, Maely Clements A, PA-C 04/16/18 2021    Vanetta MuldersZackowski, Scott, MD 04/17/18 [phone]

## 2018-04-16 NOTE — ED Triage Notes (Signed)
Patient states that she has had N/V for the last 2 days - the patient reports that she started to have Diarreah today
